# Patient Record
Sex: Male | Born: 1990 | Race: White | Hispanic: No | Marital: Single
Health system: Midwestern US, Academic
[De-identification: ages and names within clinical notes are randomized; demographics above are authoritative.]

---
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
## 2015-01-15 ENCOUNTER — Emergency Department (HOSPITAL_COMMUNITY)
Admission: EM | Admit: 2015-01-15 | Discharge: 2015-01-15 | Disposition: A | Discharge status: Home / Self Care | Payer: BLUE CROSS/BLUE SHIELD | Source: Home / Self Care | Attending: Emergency Medicine | Admitting: Emergency Medicine

## 2015-01-15 ENCOUNTER — Encounter (HOSPITAL_COMMUNITY): Payer: Self-pay | Admitting: Emergency Medicine

## 2015-01-15 ENCOUNTER — Emergency Department (INDEPENDENT_AMBULATORY_CARE_PROVIDER_SITE_OTHER): Payer: BLUE CROSS/BLUE SHIELD

## 2015-01-15 ENCOUNTER — Emergency Department (HOSPITAL_COMMUNITY): Payer: BLUE CROSS/BLUE SHIELD

## 2015-01-15 DIAGNOSIS — M533 Sacrococcygeal disorders, not elsewhere classified: Secondary | ICD-10-CM

## 2015-01-15 MED ORDER — TRAMADOL HCL 50 MG PO TABS: 50.0000 mg | ORAL_TABLET | Freq: Four times a day (QID) | ORAL | Status: DC | PRN

## 2015-01-15 NOTE — ED Notes (Signed)
Pt is here today for pain in his tail bone area, pt feels pressure in his coccyx area, he said that pain has been there for about 2 weeks

## 2015-01-15 NOTE — ED Provider Notes (Signed)
CSN: [id number]     Arrival date & time 01/15/15  1011 History   First MD Initiated Contact with Patient 01/15/15 1037     Chief Complaint  Patient presents with  . Tailbone Pain   (Consider location/radiation/quality/duration/timing/severity/associated sxs/prior Treatment) HPI  He is a 24 year old man here for evaluation of tailbone pain. He states this started about 2 weeks ago. It is described as a pressure. He denies any injury or trauma. No fevers or chills. No bowel or bladder problems. No lower extremity weakness. He states other people in the family have had pilonidal cysts before.  History reviewed. No pertinent past medical history. Past Surgical History  Procedure Laterality Date  . Spinal fusion  June 2010   History reviewed. No pertinent family history. History  Substance Use Topics  . Smoking status: Never Smoker   . Smokeless tobacco: Not on file  . Alcohol Use: Yes    Review of Systems  Constitutional: Negative for fever and chills.  Musculoskeletal:       Tailbone pain  Skin: Negative for rash and wound.  Neurological: Negative for weakness and numbness.    Allergies  Review of patient's allergies indicates no known allergies.  Home Medications   Prior to Admission medications   Medication Sig Start Date End Date Taking? Authorizing Provider  traMADol (ULTRAM) 50 MG tablet Take 1 tablet (50 mg total) by mouth every 6 (six) hours as needed. 01/15/15   Charm RingsErin J Dantae Meunier, MD   BP 160/98 mmHg  Pulse 68  Temp(Src) 98.1 F (36.7 C) (Oral)  Resp 16  SpO2 98% Physical Exam  Constitutional: He is oriented to person, place, and time. He appears well-developed and well-nourished. No distress.  Cardiovascular: Normal rate.   Pulmonary/Chest: Effort normal.  Musculoskeletal:  Back: No erythema or edema.  No abscess or cyst.  Tender over right coccyx.  Neurological: He is alert and oriented to person, place, and time.    ED Course  Procedures (including critical  care time) Labs Review Labs Reviewed - No data to display  Imaging Review Dg Sacrum/coccyx  01/15/2015   CLINICAL DATA:  Constant buttock pain which increases while sitting, no known injury, initial encounter  EXAM: SACRUM AND COCCYX - 2+ VIEW  COMPARISON:  None.  FINDINGS: Prior fusion at the lumbosacral junction is noted. The sacral ala within normal limits. No acute fracture is seen no soft tissue abnormality is noted.  IMPRESSION: No acute abnormality noted.   Electronically Signed   By: Alcide CleverMark  Lukens M.D.   On: 01/15/2015 11:42     MDM   1. Coccydynia    X-ray negative. No pilonidal cyst. We'll treat conservatively with wedge cushion, tramadol, ice. Follow-up as needed.    Charm RingsErin J Jocelin Schuelke, MD 01/15/15 [phone]

## 2015-01-15 NOTE — Discharge Instructions (Signed)
Your x-ray is normal. There is no evidence of an abscess or cyst. Please get a wedge cushion or a doughnut cushion to relieve pressure on the coccyx. Take tramadol every 6 hours as needed for pain. Apply ice 3 times a day. You should see improvement over the next 1-2 weeks. Follow-up as needed.

## 2015-01-19 ENCOUNTER — Ambulatory Visit (INDEPENDENT_AMBULATORY_CARE_PROVIDER_SITE_OTHER): Payer: BLUE CROSS/BLUE SHIELD | Admitting: Podiatry

## 2015-01-19 ENCOUNTER — Encounter: Payer: Self-pay | Admitting: Podiatry

## 2015-01-19 VITALS — BP 139/69 | HR 69 | Ht 70.0 in | Wt 212.0 lb

## 2015-01-19 DIAGNOSIS — M79673 Pain in unspecified foot: Secondary | ICD-10-CM

## 2015-01-19 DIAGNOSIS — B351 Tinea unguium: Secondary | ICD-10-CM | POA: Insufficient documentation

## 2015-01-19 MED ORDER — TERBINAFINE HCL 250 MG PO TABS: 250.0000 mg | ORAL_TABLET | Freq: Every day | ORAL | Status: AC

## 2015-01-19 NOTE — Progress Notes (Signed)
Subjective: 24 year old male presents complaining of ugly toe nails and wants to be treated.  He noted of deformed thick great toe nails. Hard callused and cracking skin first web spaces bilateral. This has been going on for many years since he was in high school playing soccer.   Review of Systems - General ROS: negative Ophthalmic ROS: negative ENT ROS: negative Allergy and Immunology ROS: negative Endocrine ROS: negative Respiratory ROS: no cough, shortness of breath, or wheezing Cardiovascular ROS: no chest pain or dyspnea on exertion Gastrointestinal ROS: no abdominal pain, change in bowel habits, or black or bloody stools Genito-Urinary ROS: no dysuria, trouble voiding, or hematuria Musculoskeletal ROS: negative Neurological ROS: no TIA or stroke symptoms Dermatological ROS: negative.  Objective: Neurovascular status are within normal. Dermatologic: Thick disfigured hallucal nails bilateral. Thick cracking skin first web spaces bilateral.  Orthopedic: No gross deformities.  Assessment: Deformed mycotic nails both great toes. Dry cracking skin first web space bilateral.  Plan: Reviewed findings and available treatment options. Rx. Lamisil.

## 2015-01-19 NOTE — Patient Instructions (Signed)
Seen for fungal nails. Rx. Lamisil take as instructed. Do daily scrub with Salsun blue dandruff shampoo. Return in 6 weeks for blood work.

## 2015-03-02 ENCOUNTER — Encounter: Payer: Self-pay | Admitting: Podiatry

## 2015-03-02 ENCOUNTER — Ambulatory Visit (INDEPENDENT_AMBULATORY_CARE_PROVIDER_SITE_OTHER): Payer: BLUE CROSS/BLUE SHIELD | Admitting: Podiatry

## 2015-03-02 VITALS — BP 116/65 | HR 60

## 2015-03-02 DIAGNOSIS — B351 Tinea unguium: Secondary | ICD-10-CM

## 2015-03-02 NOTE — Patient Instructions (Signed)
One moth follow up on fungal nail treatment. Continue with oral treatment. Return in 6 weeks.

## 2015-03-02 NOTE — Progress Notes (Signed)
6 weeks follow up on Onychomycosis.  Been taking Lamisil for over a month and seeing improving nail on left great toe nail. Noted of clear nail at proximal 1/3 of nail plate left great toe nail.  Blood drawn for liver enzyme work.  Return in 6 weeks.

## 2015-03-09 ENCOUNTER — Other Ambulatory Visit: Payer: Self-pay | Admitting: *Deleted

## 2015-04-16 ENCOUNTER — Ambulatory Visit: Payer: BLUE CROSS/BLUE SHIELD | Admitting: Podiatry

## 2016-02-24 IMAGING — DX DG SACRUM/COCCYX 2+V
3 series · 3 of 3 positions shown · non-contrast
Comparison: None.

CLINICAL DATA: Constant buttock pain which increases while sitting,
no known injury, initial encounter

EXAM:
SACRUM AND COCCYX - 2+ VIEW

[sacrum ap]
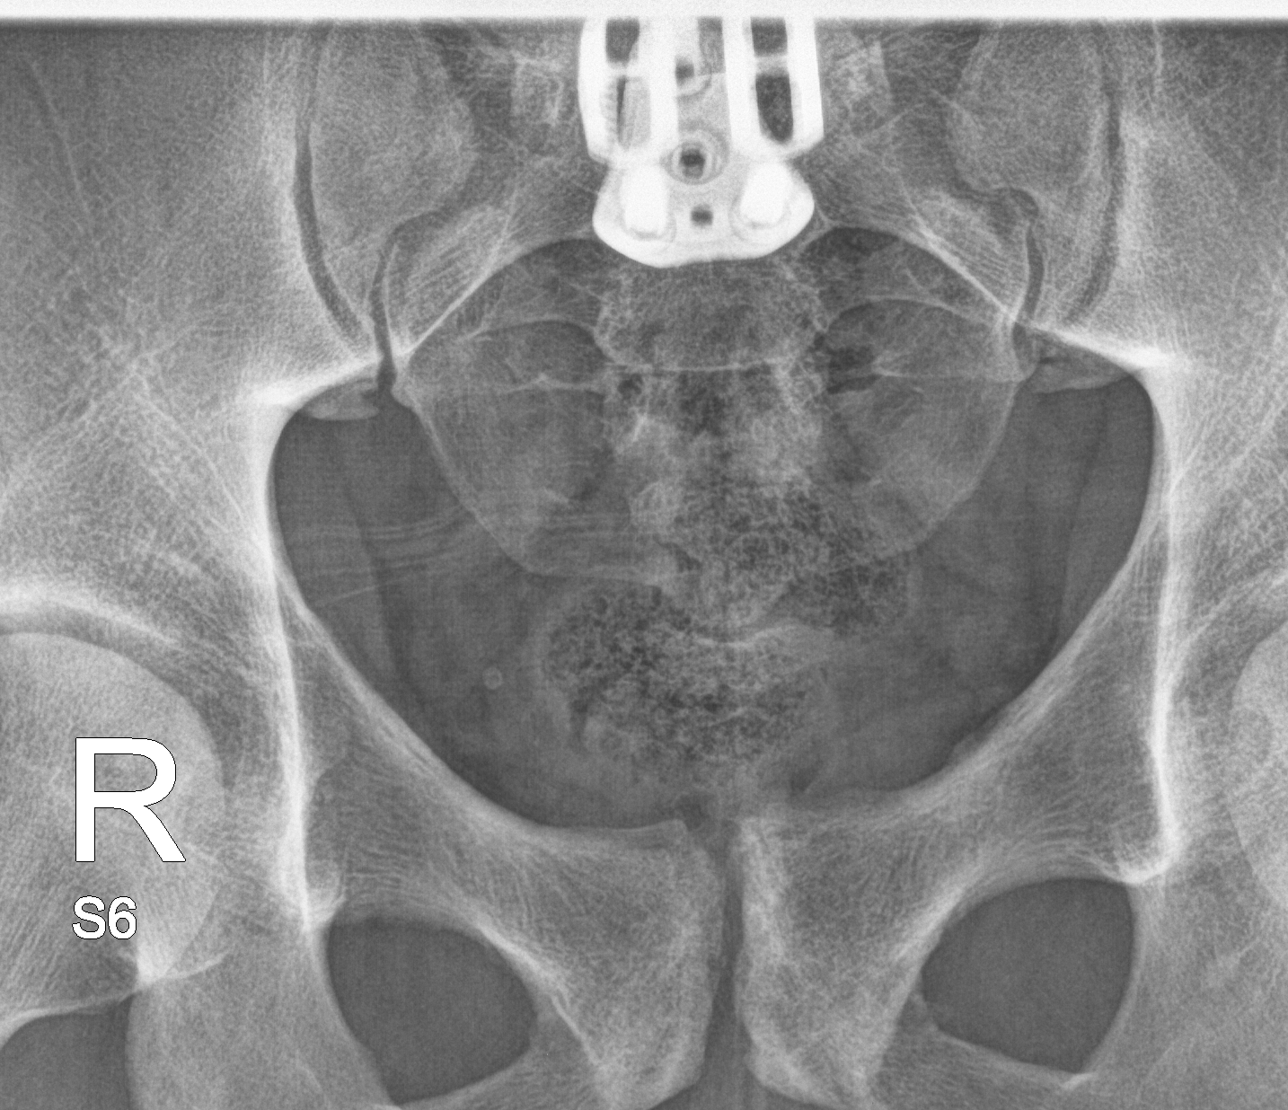

[coccyx ap]
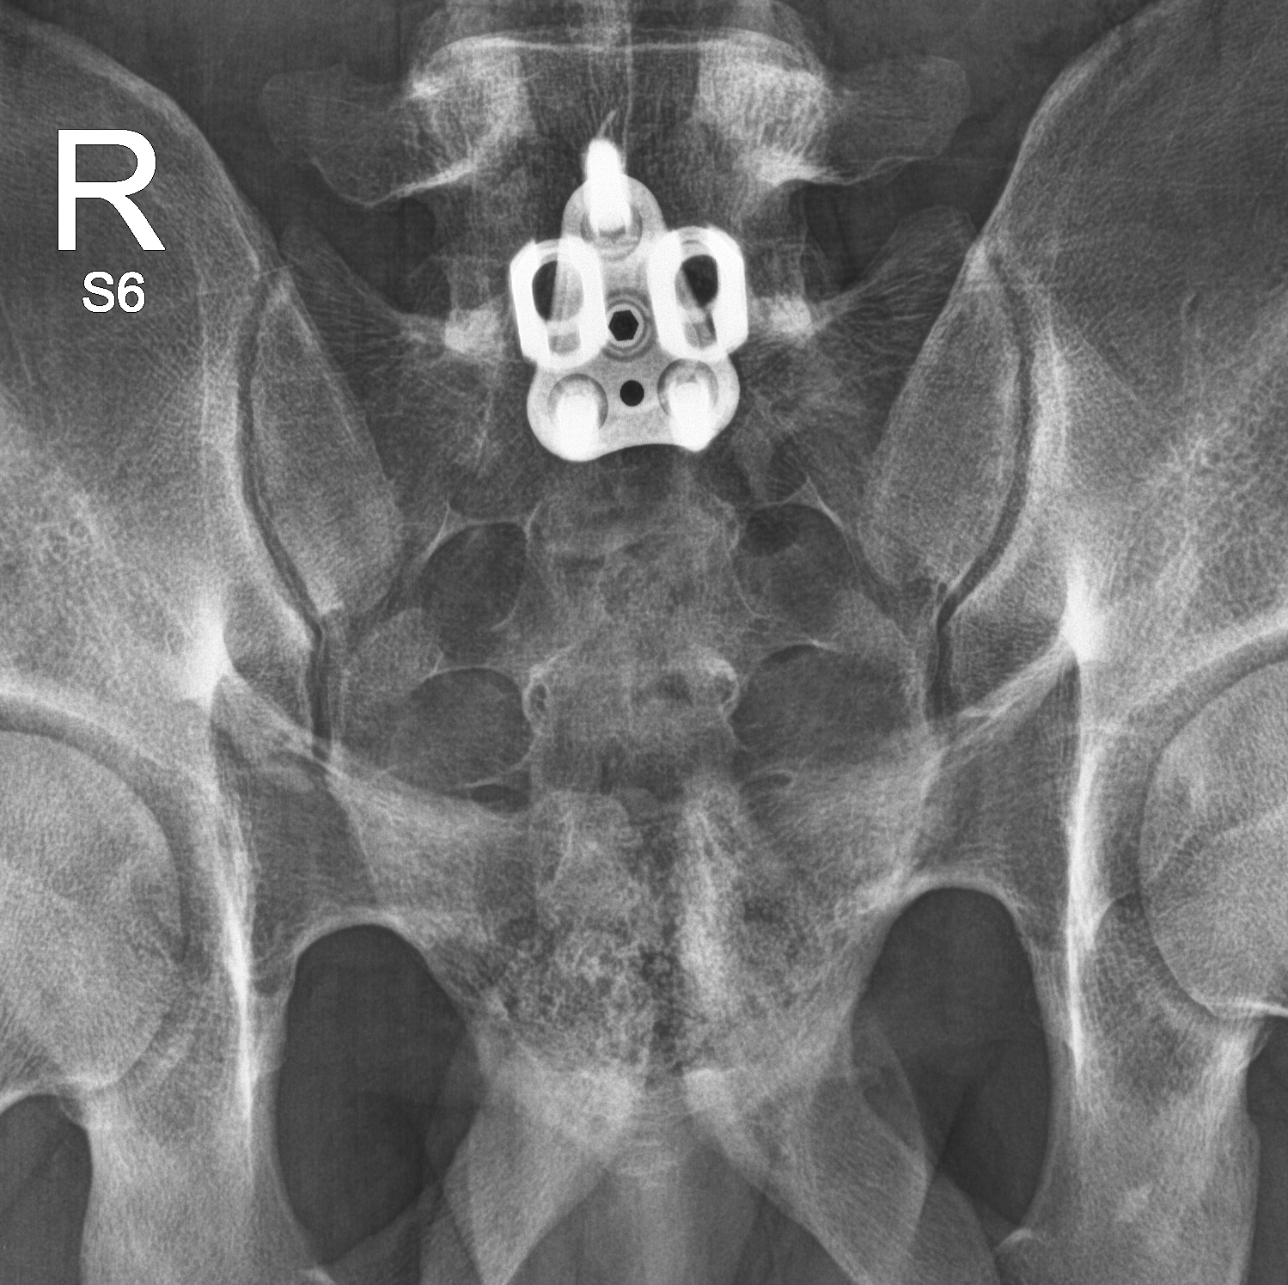

[sacrum lat]
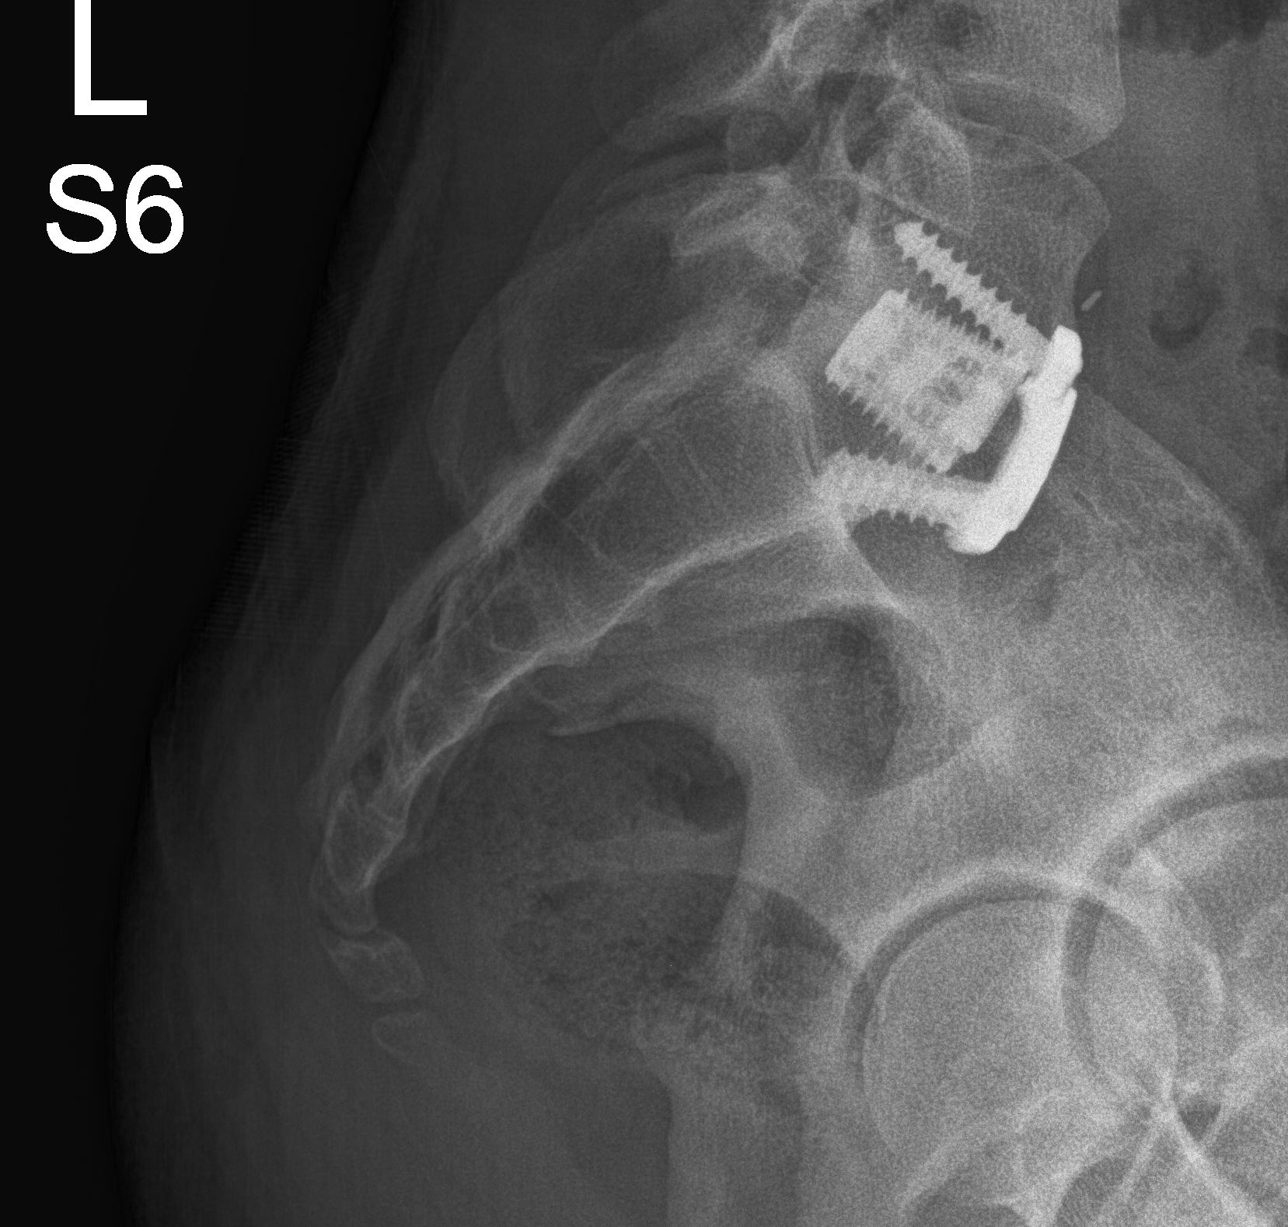

[3 of 3 positions shown; findings below may reference images not displayed]

FINDINGS: Prior fusion at the lumbosacral junction is noted. The sacral ala
within normal limits. No acute fracture is seen no soft tissue
abnormality is noted.
IMPRESSION: No acute abnormality noted.

## 2017-09-20 ENCOUNTER — Ambulatory Visit: Admit: 2017-09-20 | Discharge: 2017-09-21

## 2017-09-20 ENCOUNTER — Encounter: Admit: 2017-09-20 | Discharge: 2017-09-20

## 2017-09-20 DIAGNOSIS — Z202 Contact with and (suspected) exposure to infections with a predominantly sexual mode of transmission: ICD-10-CM

## 2017-09-20 DIAGNOSIS — Z0000 Encounter for general adult medical examination without abnormal findings: Principal | ICD-10-CM

## 2017-09-20 DIAGNOSIS — Z114 Encounter for screening for human immunodeficiency virus [HIV]: ICD-10-CM

## 2017-09-20 DIAGNOSIS — R5383 Other fatigue: ICD-10-CM

## 2017-09-20 NOTE — Progress Notes
Date of Service: 09/20/2017    Eugene Martin is a 26 y.o. male.  DOB: [birth year]  MRN: [id number]     Subjective:             History of Present Illness  Pt is here for an annual physical today.    Pt is concerned today with feeling very fatigued for the last few weeks.  He is getting enough sleep but reports feeling very lethargic.    He would like full STI testing today.    He states his bowel movements are not different from visit in February, still having some with undigested food, but realizes this may be chronic, as work up in Feb did not reveal a cause.    He is concerned about lymphomas due to many around him have the diagnosis and he is still feeling fatigued.    Pt drinks 2-3 times weekly, denies binge drinking or having a problem with drinking.      He is still very active playing soccer.            Review of Systems   Constitutional: Positive for fatigue. Negative for activity change, appetite change, chills, fever and unexpected weight change.   Respiratory: Negative for cough and shortness of breath.    Cardiovascular: Negative.    Gastrointestinal: Negative for abdominal pain, constipation, diarrhea and vomiting.   Endocrine: Negative.    Genitourinary: Negative for decreased urine volume, discharge, dysuria, genital sores, penile pain, penile swelling and testicular pain.   Musculoskeletal: Negative.    Skin: Negative for rash.   Allergic/Immunologic: Negative.    Neurological: Negative.    Psychiatric/Behavioral: Negative.          Objective:         ??? terbinafine (LAMISIL) 250 mg tablet Take 250 mg by mouth daily.     Vitals:    09/20/17 1123   BP: 128/75   Pulse: 64   Resp: 16   Temp: 37.2 ???C (99 ???F)   TempSrc: Oral   SpO2: 100%   Weight: 87.4 kg (192 lb 9.6 oz)   Height: 177.8 cm (70)     Body mass index is 27.64 kg/m???.     Physical Exam   Constitutional: He is oriented to person, place, and time. He appears well-developed and well-nourished. No distress.   HENT: Right Ear: Tympanic membrane and ear canal normal.   Left Ear: Tympanic membrane and ear canal normal.   Nose: Nose normal.   Mouth/Throat: Uvula is midline, oropharynx is clear and moist and mucous membranes are normal.   Neck: Normal range of motion. Neck supple.   Cardiovascular: Normal rate, regular rhythm, normal heart sounds and intact distal pulses.    No murmur heard.  Pulmonary/Chest: Effort normal and breath sounds normal.   Abdominal: Soft. Normal appearance. Bowel sounds are increased. There is no tenderness. No hernia.   Musculoskeletal: Normal range of motion.   Lymphadenopathy:        Head (right side): No submental, no submandibular, no tonsillar, no preauricular, no posterior auricular and no occipital adenopathy present.        Head (left side): No submental, no submandibular, no tonsillar, no preauricular, no posterior auricular and no occipital adenopathy present.     He has no cervical adenopathy.        Right cervical: No superficial cervical, no deep cervical and no posterior cervical adenopathy present.       Left cervical: No superficial cervical, no deep  cervical and no posterior cervical adenopathy present.     He has no axillary adenopathy.        Right axillary: No lateral adenopathy present.        Left axillary: No lateral adenopathy present.       Right: No inguinal adenopathy present.        Left: No inguinal adenopathy present.   Neurological: He is alert and oriented to person, place, and time.   Skin: Skin is warm and dry. Capillary refill takes less than 2 seconds. He is not diaphoretic.   Psychiatric: His behavior is normal.   Nursing note and vitals reviewed.           Assessment and Plan:      Eugene Martin was seen today for exposure to std.    Diagnoses and all orders for this visit:    Routine adult health maintenance  Discussed preventative health with patient, declines flu shot. .   Exposure to STD  -     CHLAM/NG PCR URINE; Future; Expected date: 09/20/2017 Screening for HIV (human immunodeficiency virus)  -     HIV-1/2 ANTIGEN/ANTIBODY SCREEN; Future; Expected date: 09/20/2017    Other fatigue  -     CBC AND DIFF; Future; Expected date: 09/20/2017  Will call patient regarding results.    Emotional support provided  regarding the multiple lymphomas diagnosed in his social circle.                        There are no Patient Instructions on file for this visit.

## 2017-09-21 ENCOUNTER — Encounter: Admit: 2017-09-21 | Discharge: 2017-09-21

## 2017-09-21 LAB — HIV 1& 2 AG-AB SCRN W REFLEX HIV 1 PCR QUANT

## 2017-09-21 LAB — SURESWAB CHLAMYDIA/N. GONORRHOEAE RNA

## 2017-09-21 LAB — CBC AND DIFF: Lab: 5.5 10*3/uL (ref 3.8–10.8)

## 2017-11-17 ENCOUNTER — Encounter: Admit: 2017-11-17 | Discharge: 2017-11-17

## 2017-11-17 ENCOUNTER — Ambulatory Visit: Admit: 2017-11-17 | Discharge: 2017-11-18

## 2017-11-17 DIAGNOSIS — J0100 Acute maxillary sinusitis, unspecified: Principal | ICD-10-CM

## 2017-11-17 MED ORDER — AMOXICILLIN-POT CLAVULANATE 875-125 MG PO TAB
1 | ORAL_TABLET | Freq: Two times a day (BID) | ORAL | 0 refills | 7.00000 days | Status: AC
Start: 2017-11-17 — End: ?

## 2017-11-17 MED ORDER — PREDNISONE 20 MG PO TAB
40 mg | ORAL_TABLET | Freq: Every day | ORAL | 0 refills | Status: AC
Start: 2017-11-17 — End: ?

## 2017-11-17 NOTE — Progress Notes
Date of Service: 11/17/2017    Eugene Martin is a 26 y.o. male.  DOB: 1991/08/06  MRN: [id number]     Subjective:              Chief Complaint   Patient presents with   ??? Sinus Pain     X 10 days Rt ear pain, sinus drainage and congestion        Sinus Infection   This is a new problem. Episode onset: 10 days. The problem has been waxing and waning since onset. There has been no fever. Associated symptoms include congestion, coughing, headaches, sinus pressure and a sore throat. Pertinent negatives include no chills, hoarse voice, neck pain, shortness of breath, sneezing or swollen glands. Ear pain: right > Left Fullness.     Patient reports right ear pain, pressure. Sinus pressure. Cough at times. Has taken Advil.  Denies fever, SOA.       Review of Systems   Constitutional: Negative for chills and fever.   HENT: Positive for congestion, postnasal drip, sinus pressure and sore throat. Negative for hoarse voice and sneezing. Ear pain: right > Left Fullness.    Respiratory: Positive for cough. Negative for shortness of breath, wheezing and stridor.    Cardiovascular: Negative for chest pain.   Musculoskeletal: Negative for myalgias and neck pain.   Skin: Negative for rash.   Neurological: Positive for headaches.         Objective:         ??? amoxicillin/K clavulanate (AUGMENTIN) 875/125 mg tablet Take one tablet by mouth every 12 hours for 10 days. Take with food.   ??? prednisone (DELTASONE) 20 mg tablet Take two tablets by mouth daily with breakfast for 3 days.     Vitals:    11/17/17 1308   BP: 108/72   Pulse: 69   Resp: 14   Temp: 37.2 ???C (99 ???F)   SpO2: 99%   Weight: 88.5 kg (195 lb)   Height: 177.8 cm (70)     Body mass index is 27.98 kg/m???.     Physical Exam   Constitutional: He is oriented to person, place, and time. He appears well-developed and well-nourished.   HENT:   Head: Normocephalic and atraumatic.   Right Ear: Hearing, tympanic membrane, external ear and ear canal normal. Left Ear: Hearing, tympanic membrane, external ear and ear canal normal.   Nose: Rhinorrhea present. Right sinus exhibits maxillary sinus tenderness. Left sinus exhibits maxillary sinus tenderness.   Mouth/Throat: Uvula is midline and mucous membranes are normal. Posterior oropharyngeal erythema present. No oropharyngeal exudate, posterior oropharyngeal edema or tonsillar abscesses. Tonsils are 0 on the right. Tonsils are 0 on the left. No tonsillar exudate.   Right Canal pimple noted. Attempted to pop. No surrounding erythema, drainage.   Postnasal drainage  Swollen turbinates    Eyes: Conjunctivae and EOM are normal. Pupils are equal, round, and reactive to light.   Neck: Normal range of motion.   Cardiovascular: Normal rate and regular rhythm.   Pulmonary/Chest: Effort normal and breath sounds normal.   Abdominal: Soft. Bowel sounds are normal.   Lymphadenopathy:     He has no cervical adenopathy.   Neurological: He is alert and oriented to person, place, and time. No cranial nerve deficit.   Skin: Skin is warm and dry. No rash noted.   Nursing note and vitals reviewed.           Assessment and Plan:  1. Acute non-recurrent maxillary sinusitis  Encounter Medications   Medications   ??? amoxicillin/K clavulanate (AUGMENTIN) 875/125 mg tablet     Sig: Take one tablet by mouth every 12 hours for 10 days. Take with food.     Dispense:  20 tablet     Refill:  0     Order Specific Question:   Collaborating Provider     Answer:   Fenton Malling [[id number]]   ??? prednisone (DELTASONE) 20 mg tablet     Sig: Take two tablets by mouth daily with breakfast for 3 days.     Dispense:  6 tablet     Refill:  0     Order Specific Question:   Collaborating Provider     Answer:   Fenton Malling [[id number]]          Patient Instructions   Acute Sinusitis  Upper Respiratory Infection    Take antibiotic as prescribed. It will take 2-3 days to start working, so you may feel worse before you start to feel better. Eating a yogurt per day such as Activia (or any with acidophilus) or taking probiotics(buy over the counter at pharmacy) will decrease possibility of diarrhea side effects.    Take Prednisone as directed with food.     May use mucinex(guaifenesin as generic) to thin mucous.    Use the flonase nasal spray until symptoms resolve: 2 sprays each nostril once daily to help decrease nasal congestion, swelling, and post nasal drip. (nasal steroid to reduce swelling, inflammation, and mucous production.) When using the spray bend head slightly forward and insert bottle tip just inside the nose.  Spray while very gently sniffing at the same time and repeat on the opposite side.  Avoid forceful sniffing or blowing your nose during and for at least 15 minutes afterwards using the medication, as this causes the medicine to go down the back of the throat or back outside the nose, instead of staying in the area where it will work.    Apply warm compresses to sinuses(over forehead and cheeks) 2-3 times a day    Afrin nasal spray as needed for nasal congestion- don't use more than 3 days in a row as this can cause rebound congestion.  This is an over-the-counter nasal decongestant spray.    May use pseudofed as needed for sinus congestion unless you have history of high blood pressure, then you can use phenylephrine- but would need to monitor blood pressure when taking and stop if it becomes elevated.     Use nasal sinus rinse or netti pot 2x/day to help with post-nasal drip and congestion. Can add in 1/4th to 1/2 tsp distilled white vinegar to help decrease bacterial load. Can buy over the counter at the pharmacy. USE ONLY BOTTLED WATER, DISTILLED WATER, OR BOILED WATER THAT HAS COOLED. DO NOT USE WATER STRAIGHT FROM THE TAP AS THIS CAN CAUSE SERIOUS INFECTIONS    Use humidifier in room at night    Take ibuprofen 200mg  tablets, take 2-3 every 8 hours with food to decrease inflammation in the sinuses until symptoms resolve. Stop if stomach upset occurs. If you cannot take ibuprofen for some reason, use tylenol as directed on bottle. Do not exceed dosage of 3000mg  in a day for tylenol.     If not improving in 5-7 days, need reevaluation by personal doctor.     Go to the emergency room if you develop worsening headache, temp over 103F, facial swelling, or painful stiff neck

## 2018-02-25 ENCOUNTER — Encounter: Admit: 2018-02-25 | Discharge: 2018-02-25
